# Patient Record
Sex: Male | Born: 1975 | Race: White | Hispanic: No | Marital: Single | Smoking: Never smoker
Health system: Southern US, Community
[De-identification: ages and names within clinical notes are randomized; demographics above are authoritative.]

## PROBLEM LIST (undated history)

## (undated) DIAGNOSIS — F79 Unspecified intellectual disabilities: Secondary | ICD-10-CM

## (undated) DIAGNOSIS — F840 Autistic disorder: Secondary | ICD-10-CM

## (undated) DIAGNOSIS — F919 Conduct disorder, unspecified: Secondary | ICD-10-CM

## (undated) DIAGNOSIS — R569 Unspecified convulsions: Secondary | ICD-10-CM

## (undated) HISTORY — DX: Unspecified intellectual disabilities: F79

## (undated) HISTORY — DX: Conduct disorder, unspecified: F91.9

## (undated) HISTORY — DX: Unspecified convulsions: R56.9

## (undated) HISTORY — DX: Autistic disorder: F84.0

---

## 2000-08-06 ENCOUNTER — Encounter: Payer: Self-pay | Admitting: Emergency Medicine

## 2000-08-06 ENCOUNTER — Emergency Department (HOSPITAL_COMMUNITY): Admission: EM | Admit: 2000-08-06 | Discharge: 2000-08-06 | Payer: Self-pay | Admitting: Emergency Medicine

## 2000-10-12 ENCOUNTER — Encounter: Payer: Self-pay | Admitting: Emergency Medicine

## 2000-10-12 ENCOUNTER — Emergency Department (HOSPITAL_COMMUNITY): Admission: EM | Admit: 2000-10-12 | Discharge: 2000-10-12 | Payer: Self-pay | Admitting: Emergency Medicine

## 2002-04-16 ENCOUNTER — Emergency Department (HOSPITAL_COMMUNITY): Admission: EM | Admit: 2002-04-16 | Discharge: 2002-04-16 | Payer: Self-pay | Admitting: Emergency Medicine

## 2002-04-17 ENCOUNTER — Encounter: Payer: Self-pay | Admitting: Emergency Medicine

## 2006-03-20 ENCOUNTER — Emergency Department (HOSPITAL_COMMUNITY): Admission: EM | Admit: 2006-03-20 | Discharge: 2006-03-20 | Payer: Self-pay | Admitting: Emergency Medicine

## 2006-04-24 ENCOUNTER — Encounter: Admission: RE | Admit: 2006-04-24 | Discharge: 2006-04-24 | Payer: Self-pay | Admitting: Family Medicine

## 2009-07-16 ENCOUNTER — Encounter: Admission: RE | Admit: 2009-07-16 | Discharge: 2009-07-16 | Payer: Self-pay | Admitting: Family Medicine

## 2012-04-26 ENCOUNTER — Ambulatory Visit
Admission: RE | Admit: 2012-04-26 | Discharge: 2012-04-26 | Disposition: A | Discharge status: Home / Self Care | Payer: Medicare Other | Source: Ambulatory Visit | Attending: Family Medicine | Admitting: Family Medicine

## 2012-04-26 ENCOUNTER — Other Ambulatory Visit: Payer: Self-pay | Admitting: Family Medicine

## 2012-04-26 DIAGNOSIS — M79672 Pain in left foot: Secondary | ICD-10-CM

## 2013-06-16 IMAGING — CR DG FOOT COMPLETE 3+V*L*
3 series · 3 of 3 positions shown · non-contrast
Comparison: None.

CLINICAL DATA: Pain in the region of the fourth metatarsal and toe

LEFT FOOT - COMPLETE 3+ VIEW

[t foot ap left]
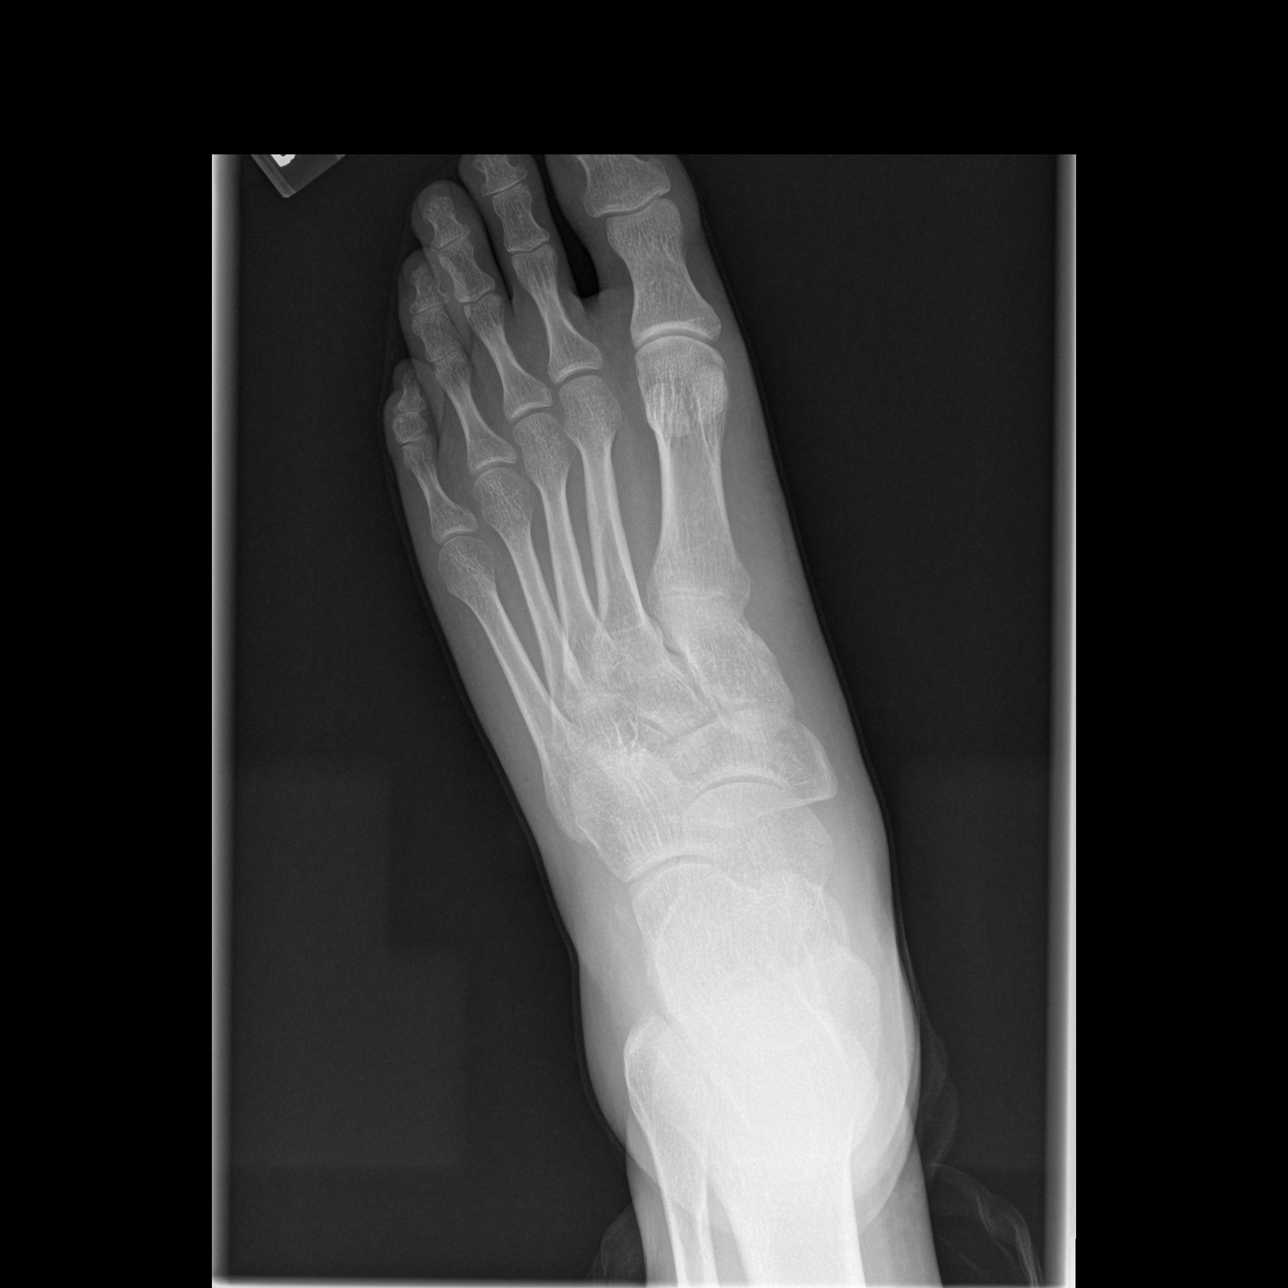

[t foot oblique left]
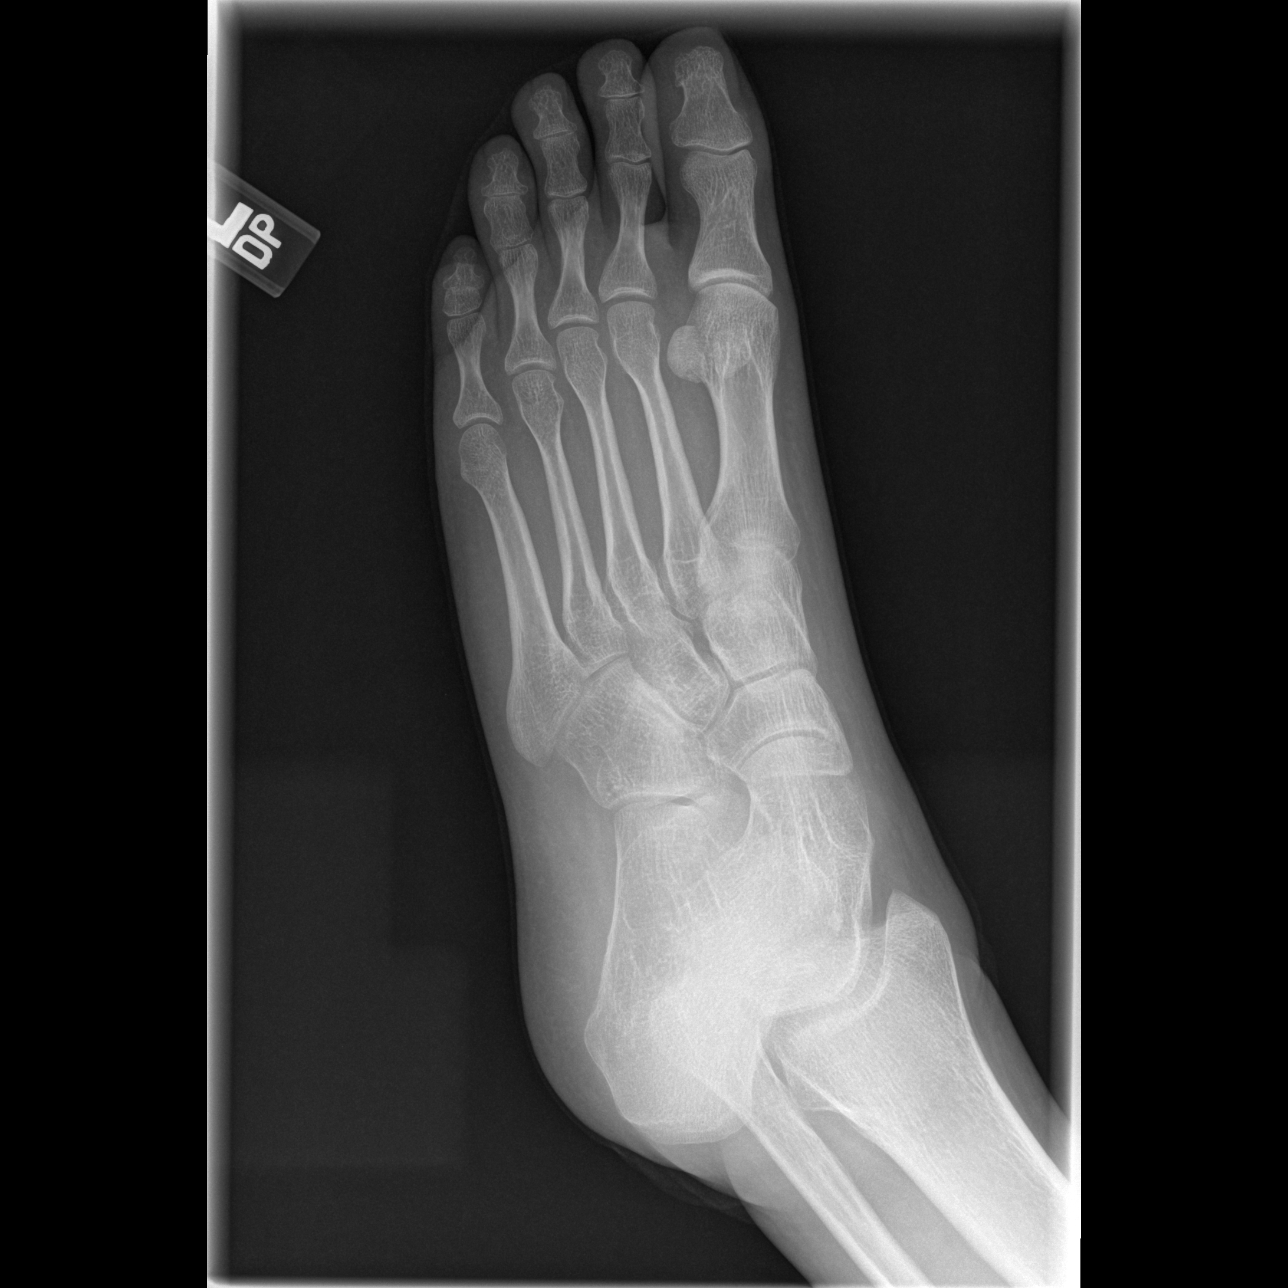

[t foot lat left]
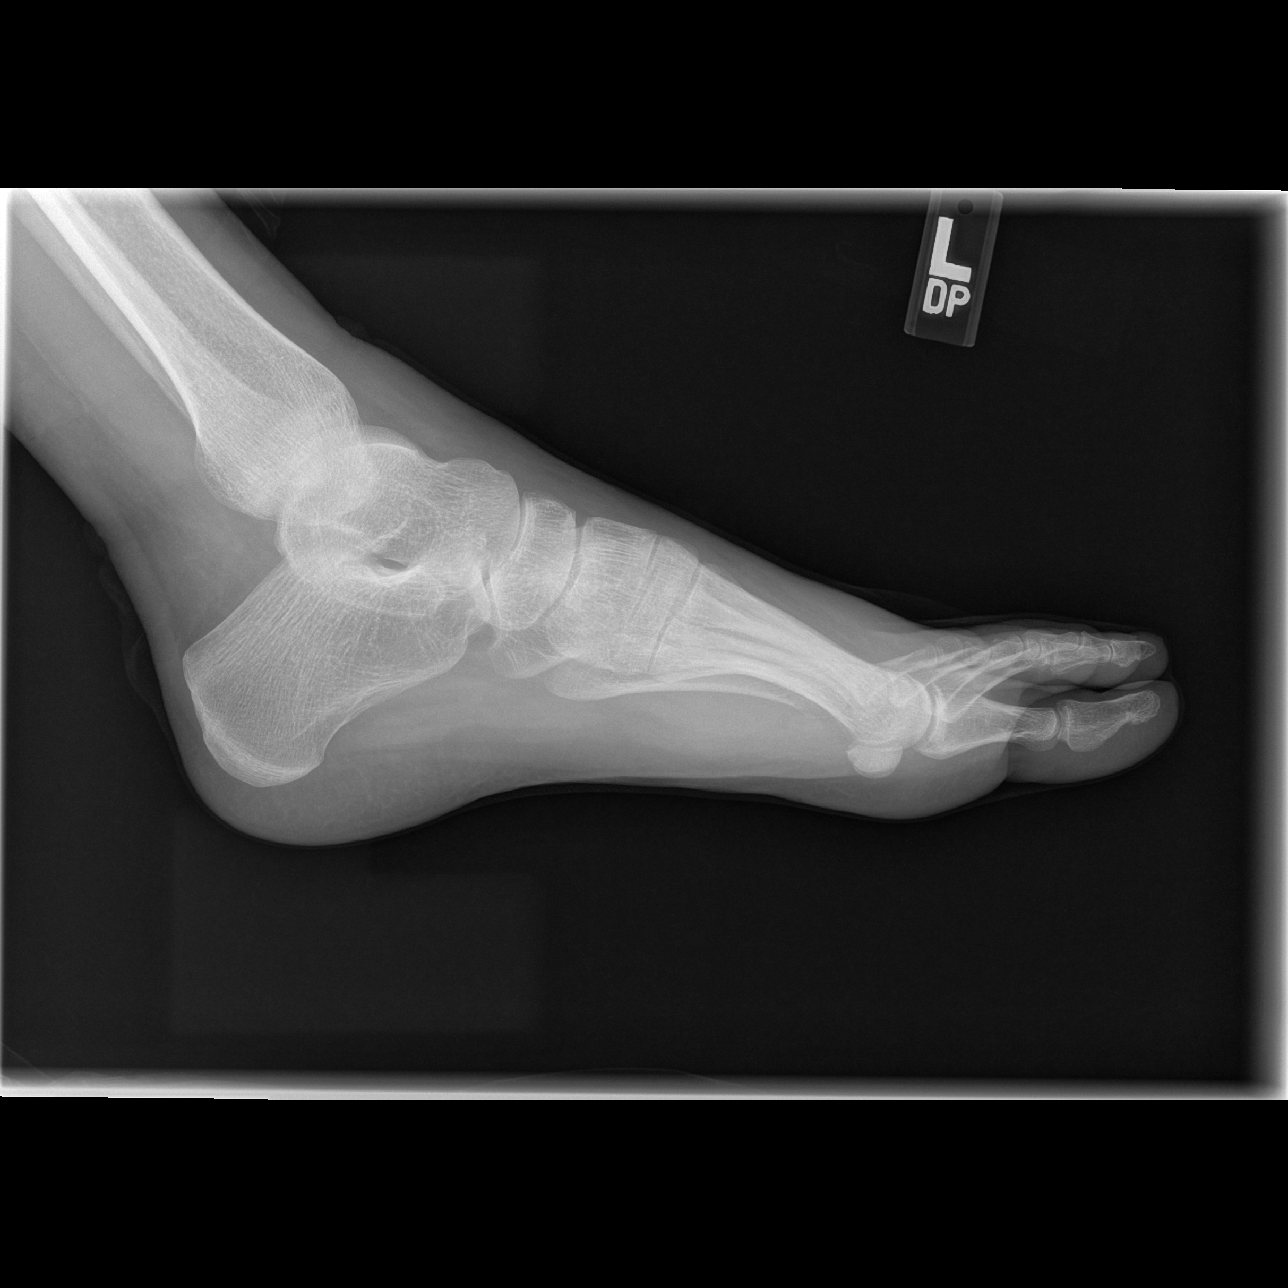

[3 of 3 positions shown; findings below may reference images not displayed]

FINDINGS: Tarsal - metatarsal alignment is normal.  No fracture is
seen.  Joint spaces appear normal.
IMPRESSION: Negative.

## 2013-07-30 ENCOUNTER — Encounter: Payer: Self-pay | Admitting: Nurse Practitioner

## 2013-08-06 ENCOUNTER — Ambulatory Visit: Payer: Self-pay | Admitting: Nurse Practitioner

## 2013-09-13 ENCOUNTER — Ambulatory Visit: Payer: Self-pay | Admitting: Nurse Practitioner

## 2013-09-13 ENCOUNTER — Telehealth: Payer: Self-pay | Admitting: Nurse Practitioner

## 2013-09-13 NOTE — Telephone Encounter (Signed)
No show for appt. 

## 2016-11-25 ENCOUNTER — Emergency Department (HOSPITAL_COMMUNITY)
Admission: EM | Admit: 2016-11-25 | Discharge: 2016-11-25 | Disposition: A | Discharge status: Home / Self Care | Payer: Medicare Other | Attending: Emergency Medicine | Admitting: Emergency Medicine

## 2016-11-25 ENCOUNTER — Encounter (HOSPITAL_COMMUNITY): Payer: Self-pay | Admitting: Emergency Medicine

## 2016-11-25 DIAGNOSIS — G40909 Epilepsy, unspecified, not intractable, without status epilepticus: Secondary | ICD-10-CM | POA: Insufficient documentation

## 2016-11-25 DIAGNOSIS — F840 Autistic disorder: Secondary | ICD-10-CM | POA: Insufficient documentation

## 2016-11-25 DIAGNOSIS — R569 Unspecified convulsions: Secondary | ICD-10-CM

## 2016-11-25 NOTE — ED Triage Notes (Signed)
Pt arrives via [facility] from adult daycare setting where pt reportedly had a unwitnessed seizure. Found on the floor after walking into another room by staff. Pt at that time awake but nonresponsive per staff. On EMs arrival pt with blank stare, following commands, bp initally 82/50, cbg 104, all other vitals WNL. Pt at present awake, nonverbal-pts baseline, follws simple commands. Pupils equal reactive. VSS.

## 2016-11-25 NOTE — Discharge Instructions (Signed)
Give your family doc a call and your neurologist and let them know that you had a seizure.  See if they want to change your medications. Return if there is any change at home, vomiting, diarrhea, fever, coughing.

## 2016-11-25 NOTE — ED Provider Notes (Signed)
[facility]-EMERGENCY DEPT Provider Note   CSN: [id number] Arrival date & time: 11/25/16  1251     History   Chief Complaint Chief Complaint  Patient presents with  . Seizures    HPI Kyle Brandt is a 41 y.o. male.  41 yo M with a chief complaint of seizure-like event. Patient was found on the ground in a room at the group home. After which she was quickly back to his baseline. Then went to the bathroom and had a large bowel movement. They felt that the event lasted for longer than 10 minutes and per protocol brought him to the Emergency department. Patient is nonverbal at baseline level V caveat. They deny recent illness, has been eating and drinking normally.  Has been taking his antiepileptics as normal.    The history is provided by the patient, the nursing home and the EMS personnel.  Seizures   This is a chronic problem. The current episode started less than 1 hour ago. The problem has been resolved. There was 1 seizure. The most recent episode lasted more than 5 minutes. Pertinent negatives include no confusion, no headaches, no visual disturbance, no chest pain, no vomiting and no diarrhea. Characteristics include loss of consciousness. The episode was not witnessed. The seizures did not continue in the ED. The seizure(s) had no focality. There has been no fever.    Past Medical History:  Diagnosis Date  . Autism   . Disruptive behavior disorder   . Mental retardation   . Seizures (HCC)     There are no active problems to display for this patient.   History reviewed. No pertinent surgical history.     Home Medications    Prior to Admission medications   Medication Sig Start Date End Date Taking? Authorizing Provider  ARIPiprazole (ABILIFY) 15 MG tablet Take 15 mg by mouth daily. One-half tablet    Historical Provider, MD  Calcium Citrate-Vitamin D (CALCIUM CITRATE +D PO) Take by mouth 2 (two) times daily.    Historical Provider, MD  carbamazepine (TEGRETOL XR) 200 MG  12 hr tablet Take 200 mg by mouth. 2 tablets in the morning and 4 tablets in the evening    Historical Provider, MD  docusate sodium (COLACE) 100 MG capsule Take 100 mg by mouth 2 (two) times daily.    Historical Provider, MD  erythromycin with ethanol (EMGEL) 2 % gel Apply topically daily. To the face    Historical Provider, MD  ketoconazole (NIZORAL) 2 % shampoo Apply 1 application topically once a week.    Historical Provider, MD  levETIRAcetam (KEPPRA) 250 MG tablet Take 250 mg by mouth 2 (two) times daily.    Historical Provider, MD  LORazepam (ATIVAN) 2 MG tablet Take 2 mg by mouth. 1 tablet 1 hour prior to dental appointment    Historical Provider, MD  magnesium hydroxide (PHILLIPS MILK OF MAGNESIA) 311 MG CHEW chewable tablet Chew 311 mg by mouth every other day.    Historical Provider, MD  methylcellulose oral powder Take by mouth 2 (two) times daily. 1 tbs in 8 oz water    Historical Provider, MD  naltrexone (DEPADE) 50 MG tablet Take 50 mg by mouth 2 (two) times daily. Take one and one-half tablet    Historical Provider, MD  Prenatal Vit-Sel-Fe Fum-FA (VINATE M PO) Take by mouth daily.    Historical Provider, MD  tamsulosin (FLOMAX) 0.4 MG CAPS capsule Take 0.4 mg by mouth.    Historical Provider, MD  Family History Family History  Problem Relation Age of Onset  . Heart disease Mother   . Diabetes Mother   . Heart disease Father   . Diabetes Father   . Ulcers    . High blood pressure    . Asthma      Social History Social History  Substance Use Topics  . Smoking status: Never Smoker  . Smokeless tobacco: Never Used  . Alcohol use No     Allergies   Patient has no known allergies.   Review of Systems Review of Systems  Constitutional: Negative for chills and fever.  HENT: Negative for congestion and facial swelling.   Eyes: Negative for discharge and visual disturbance.  Respiratory: Negative for shortness of breath.   Cardiovascular: Negative for chest pain  and palpitations.  Gastrointestinal: Negative for abdominal pain, diarrhea and vomiting.  Musculoskeletal: Negative for arthralgias and myalgias.  Skin: Negative for color change, pallor and rash.  Neurological: Positive for seizures and loss of consciousness. Negative for tremors, syncope and headaches.  Psychiatric/Behavioral: Negative for confusion and dysphoric mood.     Physical Exam Updated Vital Signs BP 124/77   Pulse 65   Temp 98.5 F (36.9 C) (Oral)   Resp 16   SpO2 96%   Physical Exam  Constitutional: He appears well-developed and well-nourished.  HENT:  Head: Normocephalic and atraumatic.  Boxers helmet, no signs of trauma  Eyes: EOM are normal. Pupils are equal, round, and reactive to light.  Neck: Normal range of motion. Neck supple. No JVD present.  Cardiovascular: Normal rate and regular rhythm.  Exam reveals no gallop and no friction rub.   No murmur heard. Pulmonary/Chest: No respiratory distress. He has no wheezes.  Abdominal: He exhibits no distension and no mass. There is no tenderness. There is no rebound and no guarding.  Musculoskeletal: Normal range of motion.  Moving all four extremities   Neurological: He is alert.  Skin: No rash noted. No pallor.  Psychiatric: He has a normal mood and affect. His behavior is normal.  Nursing note and vitals reviewed.    ED Treatments / Results  Labs (all labs ordered are listed, but only abnormal results are displayed) Labs Reviewed - No data to display  EKG  EKG Interpretation  Date/Time:  Friday November 25 2016 12:54:48 EST Ventricular Rate:  65 PR Interval:    QRS Duration: 93 QT Interval:  403 QTC Calculation: 419 R Axis:   75 Text Interpretation:  Sinus rhythm Short PR interval ST elev, probable normal early repol pattern no prolonged qt, brugada or wpw Confirmed by Charae Depaolis MD, DANIEL ([id number]) on 11/25/2016 1:06:52 PM       Radiology No results found.  Procedures Procedures (including critical  care time)  Medications Ordered in ED Medications - No data to display   Initial Impression / Assessment and Plan / ED Course  I have reviewed the triage vital signs and the nursing notes.  Pertinent labs & imaging results that were available during my care of the patient were reviewed by me and considered in my medical decision making (see chart for details).     41 yo M With chief complaint of unconsciousness. His is a syncopal event versus a seizure. He has a history of a seizure disorder and is compliant on his medications. No noted injury was found on exam. His back to his baseline. I do not feel that likely warranted at this time. We'll have him follow-up with his family physician.  Discussed his neurologist and no his recurrent episode. EKG was performed with a mildly shortened PR but no other concerning signs.  1:15 PM:  I have discussed the diagnosis/risks/treatment options with the patient and caregiver and believe the pt to be eligible for discharge home to follow-up with PCP. We also discussed returning to the ED immediately if new or worsening sx occur. We discussed the sx which are most concerning (e.g., sudden worsening pain, fever, inability to tolerate by mouth) that necessitate immediate return. Medications administered to the patient during their visit and any new prescriptions provided to the patient are listed below.  Medications given during this visit Medications - No data to display   The patient appears reasonably screen and/or stabilized for discharge and I doubt any other medical condition or other [facility] requiring further screening, evaluation, or treatment in the ED at this time prior to discharge.    Final Clinical Impressions(s) / ED Diagnoses   Final diagnoses:  Seizure [facility](HCC)    New Prescriptions New Prescriptions   No medications on file     Melene PlanDan Cyris Maalouf, DO 11/25/16 1315

## 2018-05-15 DIAGNOSIS — Z01818 Encounter for other preprocedural examination: Secondary | ICD-10-CM

## 2021-12-01 ENCOUNTER — Other Ambulatory Visit: Payer: Self-pay

## 2021-12-01 ENCOUNTER — Emergency Department (HOSPITAL_BASED_OUTPATIENT_CLINIC_OR_DEPARTMENT_OTHER)
Admission: EM | Admit: 2021-12-01 | Discharge: 2021-12-01 | Disposition: A | Discharge status: Home / Self Care | Payer: Medicare Other | Attending: Emergency Medicine | Admitting: Emergency Medicine

## 2021-12-01 ENCOUNTER — Emergency Department (HOSPITAL_BASED_OUTPATIENT_CLINIC_OR_DEPARTMENT_OTHER): Payer: Medicare Other

## 2021-12-01 ENCOUNTER — Encounter (HOSPITAL_BASED_OUTPATIENT_CLINIC_OR_DEPARTMENT_OTHER): Payer: Self-pay | Admitting: *Deleted

## 2021-12-01 ENCOUNTER — Other Ambulatory Visit (HOSPITAL_BASED_OUTPATIENT_CLINIC_OR_DEPARTMENT_OTHER): Payer: Self-pay

## 2021-12-01 DIAGNOSIS — D72829 Elevated white blood cell count, unspecified: Secondary | ICD-10-CM | POA: Diagnosis not present

## 2021-12-01 DIAGNOSIS — F840 Autistic disorder: Secondary | ICD-10-CM | POA: Insufficient documentation

## 2021-12-01 DIAGNOSIS — Z20822 Contact with and (suspected) exposure to covid-19: Secondary | ICD-10-CM | POA: Insufficient documentation

## 2021-12-01 DIAGNOSIS — E871 Hypo-osmolality and hyponatremia: Secondary | ICD-10-CM | POA: Diagnosis not present

## 2021-12-01 DIAGNOSIS — R000 Tachycardia, unspecified: Secondary | ICD-10-CM | POA: Insufficient documentation

## 2021-12-01 DIAGNOSIS — J069 Acute upper respiratory infection, unspecified: Secondary | ICD-10-CM | POA: Insufficient documentation

## 2021-12-01 DIAGNOSIS — R0981 Nasal congestion: Secondary | ICD-10-CM | POA: Diagnosis present

## 2021-12-01 LAB — CBC WITH DIFFERENTIAL/PLATELET
Abs Immature Granulocytes: 0.04 10*3/uL (ref 0.00–0.07)
Basophils Absolute: 0 10*3/uL (ref 0.0–0.1)
Basophils Relative: 0 %
Eosinophils Absolute: 0.2 10*3/uL (ref 0.0–0.5)
Eosinophils Relative: 2 %
HCT: 36.7 % — ABNORMAL LOW (ref 39.0–52.0)
Hemoglobin: 13.4 g/dL (ref 13.0–17.0)
Immature Granulocytes: 0 %
Lymphocytes Relative: 9 %
Lymphs Abs: 1.1 10*3/uL (ref 0.7–4.0)
MCH: 31.5 pg (ref 26.0–34.0)
MCHC: 36.5 g/dL — ABNORMAL HIGH (ref 30.0–36.0)
MCV: 86.2 fL (ref 80.0–100.0)
Monocytes Absolute: 0.8 10*3/uL (ref 0.1–1.0)
Monocytes Relative: 6 %
Neutro Abs: 10.3 10*3/uL — ABNORMAL HIGH (ref 1.7–7.7)
Neutrophils Relative %: 83 %
Platelets: 219 10*3/uL (ref 150–400)
RBC: 4.26 MIL/uL (ref 4.22–5.81)
RDW: 11.7 % (ref 11.5–15.5)
WBC: 12.5 10*3/uL — ABNORMAL HIGH (ref 4.0–10.5)
nRBC: 0 % (ref 0.0–0.2)

## 2021-12-01 LAB — COMPREHENSIVE METABOLIC PANEL
ALT: 19 U/L (ref 0–44)
AST: 22 U/L (ref 15–41)
Albumin: 3.8 g/dL (ref 3.5–5.0)
Alkaline Phosphatase: 78 U/L (ref 38–126)
Anion gap: 9 (ref 5–15)
BUN: 9 mg/dL (ref 6–20)
CO2: 24 mmol/L (ref 22–32)
Calcium: 8.8 mg/dL — ABNORMAL LOW (ref 8.9–10.3)
Chloride: 100 mmol/L (ref 98–111)
Creatinine, Ser: 0.72 mg/dL (ref 0.61–1.24)
GFR, Estimated: >60 mL/min (ref >60)
Glucose, Bld: 95 mg/dL (ref 70–99)
Potassium: 4.2 mmol/L (ref 3.5–5.1)
Sodium: 133 mmol/L — ABNORMAL LOW (ref 135–145)
Total Bilirubin: 0.8 mg/dL (ref 0.3–1.2)
Total Protein: 7.3 g/dL (ref 6.5–8.1)

## 2021-12-01 LAB — RESP PANEL BY RT-PCR (FLU A&B, COVID) ARPGX2
Influenza A by PCR: NEGATIVE
Influenza B by PCR: NEGATIVE
SARS Coronavirus 2 by RT PCR: NEGATIVE

## 2021-12-01 LAB — TROPONIN I (HIGH SENSITIVITY): Troponin I (High Sensitivity): 2 ng/L (ref <18)

## 2021-12-01 LAB — D-DIMER, QUANTITATIVE: D-Dimer, Quant: 0.4 ug/mL-FEU (ref 0.00–0.50)

## 2021-12-01 MED ORDER — LACTATED RINGERS IV BOLUS
1000.0000 mL | Freq: Once | INTRAVENOUS | Status: AC
  Administered 2021-12-01: 1000 mL via INTRAVENOUS

## 2021-12-01 MED ORDER — BENZONATATE 100 MG PO CAPS
100.0000 mg | ORAL_CAPSULE | Freq: Three times a day (TID) | ORAL | 0 refills | Status: AC | PRN
  Filled 2021-12-01: qty 21, 7d supply, fill #0

## 2021-12-01 NOTE — ED Provider Notes (Signed)
?[facility] EMERGENCY DEPARTMENT ?Provider Note ? ? ?CSN: [id number] ?Arrival date & time: 12/01/21  0920 ? ?  ? ?History ? ?Chief Complaint  ?Patient presents with  ? Nasal Congestion  ? ? ?Kyle Brandt is a 46 y.o. male. ? ?HPI ? ?  ? ?46 year old male with history of seizures, autism, nonverbal, intellectual disability who presents with concern for cough. ? ?He is here with group home guardian.  His point of care is the RN at the [facility]. ? ?He has had 2 to 3 days of cough and congestion.  Cough has been dry and frequent.  There was concern for some shortness of breath earlier today.  He yesterday, he had less to eat during the day.  He is otherwise not had any behavior changes.  No known fevers at the facility.  No leg pain or swelling.  No recent surgeries, immobilization.  He has not been grabbing his ears more.  He has not appeared to be in pain.  No nausea, vomiting, or diarrhea.  No known sick contacts but he does reside in a group home. ? ?History is limited by patient being nonverbal ? ?Past Medical History:  ?Diagnosis Date  ? Autism   ? Disruptive behavior disorder   ? Mental retardation   ? Seizures (HCC)   ?  ?Home Medications ?Prior to Admission medications   ?Medication Sig Start Date End Date Taking? Authorizing Provider  ?benzonatate (TESSALON) 100 MG capsule Take 1 capsule (100 mg total) by mouth 3 (three) times daily as needed for cough. 12/01/21  Yes Alvira Monday, MD  ?ARIPiprazole (ABILIFY) 15 MG tablet Take 15 mg by mouth daily. One-half tablet    [provider]  ?Calcium Citrate-Vitamin D (CALCIUM CITRATE +D PO) Take by mouth 2 (two) times daily.    [provider]  ?carbamazepine (TEGRETOL XR) 200 MG 12 hr tablet Take 200 mg by mouth. 2 tablets in the morning and 4 tablets in the evening    [provider]  ?docusate sodium (COLACE) 100 MG capsule Take 100 mg by mouth 2 (two) times daily.    [provider]  ?erythromycin with  ethanol (EMGEL) 2 % gel Apply topically daily. To the face    [provider]  ?ketoconazole (NIZORAL) 2 % shampoo Apply 1 application topically once a week.    [provider]  ?levETIRAcetam (KEPPRA) 250 MG tablet Take 250 mg by mouth 2 (two) times daily.    [provider]  ?LORazepam (ATIVAN) 2 MG tablet Take 2 mg by mouth. 1 tablet 1 hour prior to dental appointment    [provider]  ?magnesium hydroxide (PHILLIPS MILK OF MAGNESIA) 311 MG CHEW chewable tablet Chew 311 mg by mouth every other day.    [provider]  ?methylcellulose oral powder Take by mouth 2 (two) times daily. 1 tbs in 8 oz water    [provider]  ?naltrexone (DEPADE) 50 MG tablet Take 50 mg by mouth 2 (two) times daily. Take one and one-half tablet    [provider]  ?Prenatal Vit-Sel-Fe Fum-FA (VINATE M PO) Take by mouth daily.    [provider]  ?tamsulosin (FLOMAX) 0.4 MG CAPS capsule Take 0.4 mg by mouth.    [provider]  ?   ? ?Allergies    ?Patient has no known allergies.   ? ?Review of Systems   ?Review of Systems ?See above ? ? ?Physical Exam ?Updated Vital Signs ?  BP 120/78   Pulse (!) 108   Temp 98.9 ?F (37.2 ?C) (Oral)   Resp (!) 23   SpO2 96%  ?Physical Exam ?Vitals and nursing note reviewed.  ?Constitutional:   ?   General: He is not in acute distress. ?   Appearance: He is well-developed. He is not diaphoretic.  ?HENT:  ?   Head: Normocephalic and atraumatic.  ?Eyes:  ?   Conjunctiva/sclera: Conjunctivae normal.  ?Cardiovascular:  ?   Rate and Rhythm: Regular rhythm. Tachycardia present.  ?   Heart sounds: Normal heart sounds. No murmur heard. ?  No friction rub. No gallop.  ?Pulmonary:  ?   Effort: Pulmonary effort is normal. No respiratory distress.  ?   Breath sounds: Normal breath sounds. No wheezing or rales.  ?Abdominal:  ?   General: There is no distension.  ?   Palpations: Abdomen is soft.  ?   Tenderness: There is no abdominal  tenderness. There is no guarding.  ?Musculoskeletal:  ?   Cervical back: Normal range of motion.  ?Skin: ?   General: Skin is warm and dry.  ?Neurological:  ?   Mental Status: He is alert.  ?   Comments: Alert, follows commands  ? ? ?ED Results / Procedures / Treatments   ?Labs ?(all labs ordered are listed, but only abnormal results are displayed) ?Labs Reviewed  ?CBC WITH DIFFERENTIAL/PLATELET - Abnormal; Notable for the following components:  ?    Result Value  ? WBC 12.5 (*)   ? HCT 36.7 (*)   ? MCHC 36.5 (*)   ? Neutro Abs 10.3 (*)   ? All other components within normal limits  ?COMPREHENSIVE METABOLIC PANEL - Abnormal; Notable for the following components:  ? Sodium 133 (*)   ? Calcium 8.8 (*)   ? All other components within normal limits  ?RESP PANEL BY RT-PCR (FLU A&B, COVID) [id number]  ?D-DIMER, QUANTITATIVE  ?TROPONIN I (HIGH SENSITIVITY)  ?TROPONIN I (HIGH SENSITIVITY)  ? ? ?EKG ?EKG Interpretation ? ?Date/Time:  Wednesday December 01 2021 09:53:53 EDT ?Ventricular Rate:  108 ?PR Interval:  169 ?QRS Duration: 82 ?QT Interval:  326 ?QTC Calculation: 437 ?R Axis:   66 ?Text Interpretation: Sinus tachycardia Probable left atrial enlargement Abnormal R-wave progression, early transition Since prior ECG, heart rate has increased, no other significant changes Confirmed by Alvira MondaySchlossman, Kaysea Raya ([id number]) on 12/01/2021 9:56:26 AM ? ?Radiology ?DG Chest Portable 1 View ? ?Result Date: 12/01/2021 ?CLINICAL DATA:  Cough EXAM: PORTABLE CHEST 1 VIEW COMPARISON:  AP chest 07/16/2009 FINDINGS: Mildly decreased lung volumes. Within this limitation, cardiac silhouette appears at the upper limits of normal size to mildly enlarged for AP technique. Mediastinal contours are grossly unremarkable. No definite focal airspace opacity to indicate pneumonia. The patient's chin obscures the superior right lung. No pulmonary edema, pleural effusion, or pneumothorax. No acute skeletal abnormality. IMPRESSION: No acute lung process is seen.  Electronically Signed   By: Neita Garnetonald  Viola M.D.   On: 12/01/2021 10:10   ? ?Procedures ?Procedures  ? ? ?Medications Ordered in ED ?Medications  ?lactated ringers bolus 1,000 mL (1,000 mLs Intravenous New Bag/Given 12/01/21 1044)  ? ? ?ED Course/ Medical Decision Making/ A&P ?  ? ? ?46 year old male with history of seizures, autism, nonverbal, intellectual disability who presents with concern for cough, congestion. ? ?Differential diagnosis for dyspnea includes pulmonary edema, pneumonia, viral etiology such as COVID 19 infection.  Oropharyngeal exam somewhat limited but no sign of PTA, no neck stiffness or  sigsn of RPA, history exam not consistent with epiglottitis.  No leg swelling to give concern for CHF or DVT. ? ? ?Chest x-ray was done and evaluated by me which showed no sign of pneumonia, pneumothorax, or pulmonary edema. ? ?EKG was evaluated by me which showed sinus tachycardia without other acute findings.   ? ?Given tachycardia and cough, additional lab work was obtained which showed no significant anemia, very mild hyponatremia, nonspecific mild leukocytosis.  Troponin was negative.  He is low risk Wells with a negative D-dimer and have low suspicion for pulmonary embolus.  COVID and flu testing are negative. ? ?Suspect cough is secondary to other viral respiratory infection, and mild tachycardia is secondary to dehydration in the setting of decreased appetite yesterday.  He was given IV fluids with improvement in his heart rate down to the 80s. ? ?Recommend continued supportive care for respiratory infection, Tylenol and ibuprofen as needed, and given prescription for Tessalon Perles for cough. ? ? ? ? ? ? ? ? ? ?Final Clinical Impression(s) / ED Diagnoses ?Final diagnoses:  ?Upper respiratory tract infection, unspecified type  ? ? ?Rx / DC Orders ?ED Discharge Orders   ? ?      Ordered  ?  benzonatate (TESSALON) 100 MG capsule  3 times daily PRN       ? 12/01/21 1155  ? ?  ?  ? ?  ? ? ?  ?Alvira Monday,  MD ?12/01/21 1159 ? ?

## 2021-12-01 NOTE — ED Triage Notes (Signed)
Cough and congestion 2-3 days   denies fever ?

## 2023-01-21 IMAGING — DX DG CHEST 1V PORT
1 series · 1 of 1 positions shown · non-contrast
Comparison: AP chest 07/16/2009

CLINICAL DATA: Cough

EXAM:
PORTABLE CHEST 1 VIEW

[chest ap]
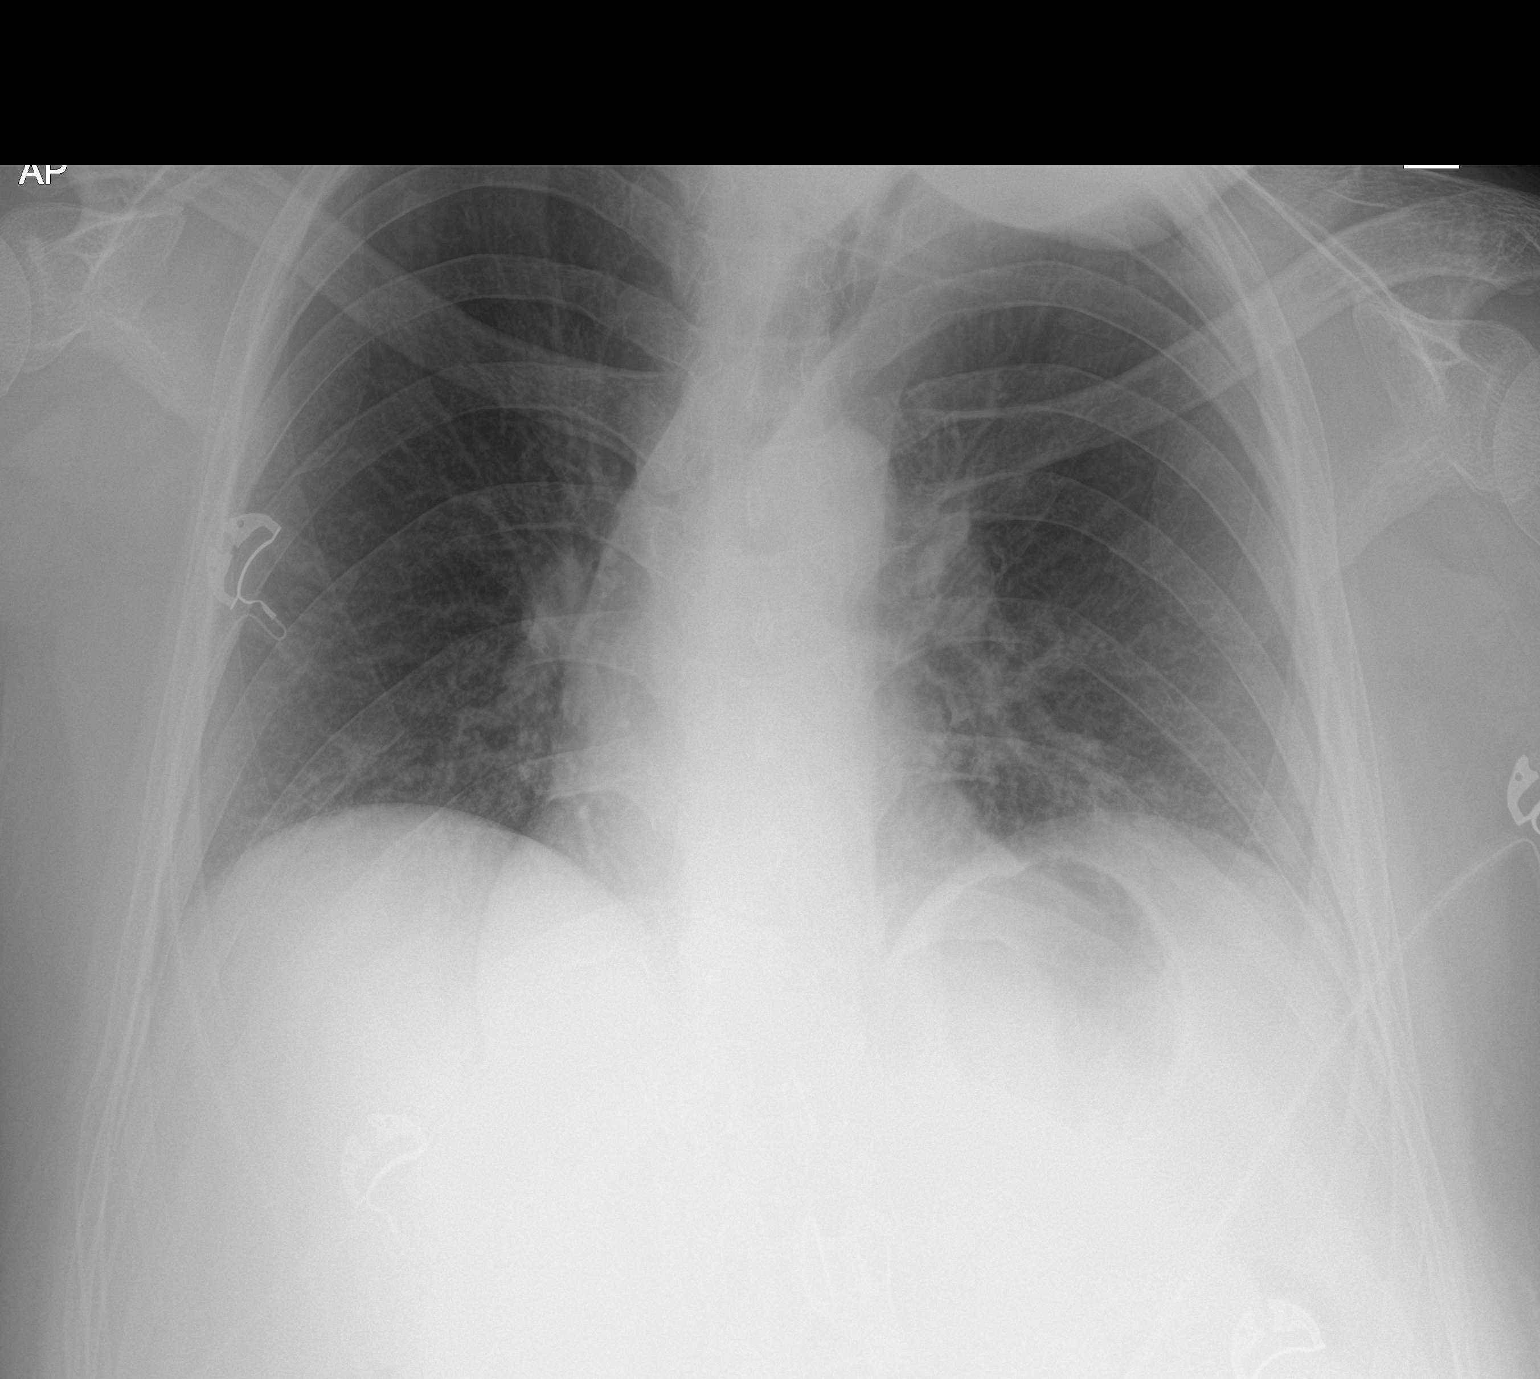

[1 of 1 positions shown; findings below may reference images not displayed]

FINDINGS: Mildly decreased lung volumes. Within this limitation, cardiac
silhouette appears at the upper limits of normal size to mildly
enlarged for AP technique. Mediastinal contours are grossly
unremarkable. No definite focal airspace opacity to indicate
pneumonia. The patient's chin obscures the superior right lung. No
pulmonary edema, pleural effusion, or pneumothorax. No acute
skeletal abnormality.
IMPRESSION: No acute lung process is seen.
